# Patient Record
Sex: Female | Born: 1995 | Race: Black or African American | Hispanic: No | Marital: Single | Smoking: Never smoker
Health system: Southern US, Community
[De-identification: ages and names within clinical notes are randomized; demographics above are authoritative.]

## PROBLEM LIST (undated history)

## (undated) DIAGNOSIS — F988 Other specified behavioral and emotional disorders with onset usually occurring in childhood and adolescence: Secondary | ICD-10-CM

---

## 2016-12-24 ENCOUNTER — Encounter (HOSPITAL_COMMUNITY): Payer: Self-pay | Admitting: Emergency Medicine

## 2016-12-24 ENCOUNTER — Ambulatory Visit (HOSPITAL_COMMUNITY)
Admission: EM | Admit: 2016-12-24 | Discharge: 2016-12-24 | Disposition: A | Discharge status: Home / Self Care | Payer: BLUE CROSS/BLUE SHIELD | Attending: Family Medicine | Admitting: Family Medicine

## 2016-12-24 DIAGNOSIS — J029 Acute pharyngitis, unspecified: Secondary | ICD-10-CM | POA: Insufficient documentation

## 2016-12-24 HISTORY — DX: Other specified behavioral and emotional disorders with onset usually occurring in childhood and adolescence: F98.8

## 2016-12-24 LAB — POCT RAPID STREP A: STREPTOCOCCUS, GROUP A SCREEN (DIRECT): NEGATIVE

## 2016-12-24 NOTE — Discharge Instructions (Signed)
We have sent your throat swab for culture and will contact you with the results. You may use OTC medicines such as Tylenol and ibuprofen.

## 2016-12-24 NOTE — ED Provider Notes (Signed)
  [facility]   [id number] 12/24/16 Arrival Time: 1321  ASSESSMENT & PLAN:  1. Sore throat    Rapid strep negative. Culture sent. OTC symptom care. Will call with culture results.  Reviewed expectations re: course of current medical issues. Questions answered. Outlined signs and symptoms indicating need for more acute intervention. Patient verbalized understanding. After Visit Summary given.   SUBJECTIVE:  Kathleen Black is a 21 y.o. female who presents with complaint of acute sore throat beginning yesterday. Afebrile. No respiratory symptoms. No neck pain or swelling. Tolerating normal PO intake but slight discomfort with swallowing. No OTC treatment. No rashes.  ROS: As per HPI.   OBJECTIVE:  Vitals:   12/24/16 1343  BP: 136/89  Pulse: 92  Resp: 18  Temp: 98.3 F (36.8 C)  TempSrc: Oral  SpO2: 97%     General appearance: alert; no distress HEENT: throat with mild erythema and tonsil enlargement; minimal exudate Neck: supple Lungs: clear to auscultation bilaterally Heart: regular rate and rhythm Skin: warm and dry Psychological:  alert and cooperative; normal mood and affect  Results for orders placed or performed during the hospital encounter of 12/24/16  POCT rapid strep A [facility]  Result Value Ref Range   Streptococcus, Group A Screen (Direct) NEGATIVE NEGATIVE    Labs Reviewed  POCT RAPID STREP A    No Known Allergies  PMHx, SurgHx, SocialHx, Medications, and Allergies were reviewed in the Visit Navigator and updated as appropriate.      Mardella LaymanHagler, Breck Hollinger, MD 12/26/16 [phone]

## 2016-12-24 NOTE — ED Notes (Signed)
Obtained throat swab, labeled and placed in lab

## 2016-12-24 NOTE — ED Triage Notes (Signed)
The patient presented to the [facility] with a complaint of a sore throat x 1 day. 

## 2016-12-27 LAB — CULTURE, GROUP A STREP (THRC)

## 2017-02-09 ENCOUNTER — Encounter (HOSPITAL_COMMUNITY): Payer: Self-pay | Admitting: Emergency Medicine

## 2017-02-09 ENCOUNTER — Ambulatory Visit (HOSPITAL_COMMUNITY)
Admission: EM | Admit: 2017-02-09 | Discharge: 2017-02-09 | Disposition: A | Discharge status: Home / Self Care | Payer: BLUE CROSS/BLUE SHIELD | Attending: Family Medicine | Admitting: Family Medicine

## 2017-02-09 DIAGNOSIS — J069 Acute upper respiratory infection, unspecified: Secondary | ICD-10-CM

## 2017-02-09 DIAGNOSIS — H9203 Otalgia, bilateral: Secondary | ICD-10-CM

## 2017-02-09 NOTE — ED Triage Notes (Signed)
Pt reports nasal congestion and drainage x2 days.  Yesterday she began having bilateral ear pain.

## 2017-02-14 NOTE — ED Provider Notes (Signed)
  [facility]   [id number] 02/09/17 Arrival Time: 1334  ASSESSMENT & PLAN:  1. Otalgia of both ears   2. Viral upper respiratory tract infection    OTC analgesics and symptom care as needed. FU in 48-72 hours if not improving, sooner if needed.  Reviewed expectations re: course of current medical issues. Questions answered. Outlined signs and symptoms indicating need for more acute intervention. Patient verbalized understanding. After Visit Summary given.   SUBJECTIVE:  Kathleen Black is a 21 y.o. female who presents with complaint of nasal congestion, post-nasal drainage, and otalgia of both ears. Abrupt onset starting 2 days ago. No ear drainage or hearing changes. Describes otalgia as pressure. Afebrile. Mild dry cough. No OTC treatment.   ROS: As per HPI.   OBJECTIVE:  Vitals:   02/09/17 1424  BP: 115/63  Pulse: 86  Temp: 98.6 F (37 C)  TempSrc: Oral  SpO2: 100%     General appearance: alert; no distress HEENT: nasal congestion; clear runny nose; throat irritation secondary to post-nasal drainage; TMs appear normal Neck: supple without LAD Lungs: clear to auscultation bilaterally Skin: warm and dry Psychological: alert and cooperative; normal mood and affect  No Known Allergies  Past Medical History:  Diagnosis Date  . ADD (attention deficit disorder)    Social History   Social History  . Marital status: Single    Spouse name: N/A  . Number of children: N/A  . Years of education: N/A   Occupational History  . Not on file.   Social History Main Topics  . Smoking status: Never Smoker  . Smokeless tobacco: Never Used  . Alcohol use Yes     Comment: social  . Drug use: No  . Sexual activity: Not on file   Other Topics Concern  . Not on file   Social History Narrative  . No narrative on file           Mardella Layman, MD 02/14/17 [phone]

## 2017-05-21 ENCOUNTER — Encounter (HOSPITAL_COMMUNITY): Payer: Self-pay

## 2017-05-21 ENCOUNTER — Emergency Department (HOSPITAL_COMMUNITY)
Admission: EM | Admit: 2017-05-21 | Discharge: 2017-05-21 | Disposition: A | Discharge status: Home / Self Care | Payer: BLUE CROSS/BLUE SHIELD | Attending: Emergency Medicine | Admitting: Emergency Medicine

## 2017-05-21 ENCOUNTER — Emergency Department (HOSPITAL_COMMUNITY): Payer: BLUE CROSS/BLUE SHIELD

## 2017-05-21 ENCOUNTER — Other Ambulatory Visit: Payer: Self-pay

## 2017-05-21 DIAGNOSIS — Z79899 Other long term (current) drug therapy: Secondary | ICD-10-CM | POA: Diagnosis not present

## 2017-05-21 DIAGNOSIS — Y939 Activity, unspecified: Secondary | ICD-10-CM | POA: Insufficient documentation

## 2017-05-21 DIAGNOSIS — Y929 Unspecified place or not applicable: Secondary | ICD-10-CM | POA: Insufficient documentation

## 2017-05-21 DIAGNOSIS — S92535A Nondisplaced fracture of distal phalanx of left lesser toe(s), initial encounter for closed fracture: Secondary | ICD-10-CM

## 2017-05-21 DIAGNOSIS — S99922A Unspecified injury of left foot, initial encounter: Secondary | ICD-10-CM | POA: Diagnosis present

## 2017-05-21 DIAGNOSIS — Y999 Unspecified external cause status: Secondary | ICD-10-CM | POA: Insufficient documentation

## 2017-05-21 DIAGNOSIS — W010XXA Fall on same level from slipping, tripping and stumbling without subsequent striking against object, initial encounter: Secondary | ICD-10-CM | POA: Insufficient documentation

## 2017-05-21 MED ORDER — NAPROXEN 250 MG PO TABS
500.0000 mg | ORAL_TABLET | Freq: Once | ORAL | Status: AC
  Administered 2017-05-21: 500 mg via ORAL
  Filled 2017-05-21: qty 2

## 2017-05-21 MED ORDER — NAPROXEN 500 MG PO TABS: 500.0000 mg | ORAL_TABLET | Freq: Two times a day (BID) | ORAL | 0 refills | Status: AC

## 2017-05-21 NOTE — ED Provider Notes (Signed)
[facility] EMERGENCY DEPARTMENT Provider Note   CSN: [id number] Arrival date & time: 05/21/17  7829     History   Chief Complaint No chief complaint on file.   HPI Kathleen Black is a 22 y.o. female who presents to the emergency department status post injury to her left fourth toe yesterday evening.  Patient states that she was walking up a ramp in socks and drug the toe causing her to stumble.  States she has had pain to the right fourth toe radiating to the entire midfoot since the incident.  Currently rates pain 9 out of 10 in severity, worse with movement. Denies numbness or weakness. Last tetanus was 1 year ago.   HPI  Past Medical History:  Diagnosis Date  . ADD (attention deficit disorder)     There are no active problems to display for this patient.   History reviewed. No pertinent surgical history.  OB History    No data available       Home Medications    Prior to Admission medications   Medication Sig Start Date End Date Taking? Authorizing Provider  Liraglutide -Weight Management (SAXENDA Langdon) Inject into the skin.    [provider]  PRESCRIPTION MEDICATION     [provider]    Family History History reviewed. No pertinent family history.  Social History Social History   Tobacco Use  . Smoking status: Never Smoker  . Smokeless tobacco: Never Used  Substance Use Topics  . Alcohol use: Yes    Comment: social  . Drug use: No     Allergies   Patient has no known allergies.   Review of Systems Review of Systems  Constitutional: Negative for chills and fever.  Musculoskeletal:       Left 4th toe pain.  Neurological: Negative for weakness and numbness.     Physical Exam Updated Vital Signs BP (!) 115/91 (BP Location: Left Arm)   Pulse 88   Temp 98.4 F (36.9 C) (Oral)   Resp 16   SpO2 98%   Physical Exam  Constitutional: She appears well-developed and well-nourished. No distress.  HENT:    Head: Normocephalic and atraumatic.  Eyes: Conjunctivae are normal. Right eye exhibits no discharge. Left eye exhibits no discharge.  Cardiovascular:  Pulses:      Dorsalis pedis pulses are 2+ on the right side, and 2+ on the left side.  Musculoskeletal:  L foot: Ecchymosis over the 4th digit which extends to MTP area. This area is swollen and tender to palpation. There is a small abrasion over the distal phalanx. Otherwise no bony tenderness- specifically no tenderness at base of the 5th, navicular bone, or medial/lateral malleolus.   Neurological: She is alert.  Clear speech. Sensation grossly intact to lower extremities. Patient is able to move all digits, able to move 4th digit minimally secondary to pain.   Skin: Capillary refill takes less than 2 seconds.  Psychiatric: She has a normal mood and affect. Her behavior is normal. Thought content normal.  Nursing note and vitals reviewed.    ED Treatments / Results  Labs (all labs ordered are listed, but only abnormal results are displayed) Labs Reviewed - No data to display  EKG  EKG Interpretation None       Radiology Dg Foot Complete Left  Result Date: 05/21/2017 CLINICAL DATA:  Left foot pain/ injury EXAM: LEFT FOOT - COMPLETE 3+ VIEW COMPARISON:  None. FINDINGS: Transverse fracture involving the base of the  distal 4th digit. 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 Mild soft tissue swelling of the distal 4th digit. IMPRESSION: Transverse fracture involving the distal 4th digit, as above. Electronically Signed   By: Charline BillsSriyesh  Krishnan M.D.   On: 05/21/2017 10:55    Procedures Procedures (including critical care time)  Medications Ordered in ED Medications  naproxen (NAPROSYN) tablet 500 mg (not administered)   Initial Impression / Assessment and Plan / ED Course  I have reviewed the triage vital signs and the nursing notes.  Pertinent  labs & imaging results that were available during my care of the patient were reviewed by me and considered in my medical decision making (see chart for details).  Patient presents with L 4th toe pain s/p injury yesterday. X-ray revealed transverse fracture involving the 4th distal phalanx. Patient able to move this digit, sensation intact, <2 second refill. Pain managed in ED with Naproxen. Patient's toe buddy taped, placed patient in post op shoe. Will discharge home with Naproxen. Patient advised to follow up with orthopedics for further evaluation and treatment. I discussed results, treatment plan, need for ortho follow-up, and return precautions with the patient. Provided opportunity for questions, patient confirmed understanding and is in agreement with plan. Findings and plan of care discussed with supervising physician Dr. Ranae PalmsYelverton who is in agreement with plan.    Final Clinical Impressions(s) / ED Diagnoses   Final diagnoses:  Closed nondisplaced fracture of distal phalanx of lesser toe of left foot, initial encounter    ED Discharge Orders        Ordered    naproxen (NAPROSYN) 500 MG tablet  2 times daily     05/21/17 1239       Brissia Delisa, NapoleonSamantha R, PA-C 05/21/17 1717    Loren RacerYelverton, David, MD 05/21/17 2003

## 2017-05-21 NOTE — ED Notes (Signed)
States she was getting off the bus last pm didn't have any shoes on just socks states her foot slipped and she heard her toe pop. C/o pain 4th toe left foot.

## 2017-05-21 NOTE — Discharge Instructions (Signed)
Please read and follow all provided instructions.  You have been seen today for pain in your left foot.   Tests performed today include: An x-ray of the affected area - shoes a fracture in the most distal bone of your left 4th toe.  Vital signs. See below for your results today.   Home care instructions: -- *PRICE in the first 24-48 hours after injury: Protect (with brace, splint, sling), if given by your provider Rest Ice- Do not apply ice pack directly to your skin, place towel or similar between your skin and ice/ice pack. Apply ice for 20 min, then remove for 40 min while awake Compression- Wear brace, elastic bandage, splint as directed by your provider Elevate affected extremity above the level of your heart when not walking around for the first 24-48 hours   I have given you a prescription for naproxen- this is a nonsteroidal anti-inflammatory medication -will help with pain and swelling.  Be sure to take this with food as it can cause stomach upset, and at worst stomach bleeding.  Do not take other NSAIDs with this medication including Advil, Motrin, or Aleve.  You may supplement with Tylenol.  Follow-up instructions: Please follow-up with the orthopedic surgeon providing her discharge instructions within the next 5 days.  Call tomorrow to make an appointment for sometime this week.  Return instructions:  Please return if your toes or feet are numb or tingling, appear gray or blue, or you have severe pain (also elevate the leg and loosen splint or wrap if you were given one) Please return to the Emergency Department if you experience worsening symptoms.  Please return if you have any other emergent concerns. Additional Information:  Your vital signs today were: BP (!) 115/91 (BP Location: Left Arm)    Pulse 88    Temp 98.4 F (36.9 C) (Oral)    Resp 16    SpO2 98%  If your blood pressure (BP) was elevated above 135/85 this visit, please have this repeated by your doctor within  one month. ---------------

## 2017-05-21 NOTE — ED Triage Notes (Signed)
Patient complains of left foot and toe pain after rolling same last night while getting off bus, pain with ambulation

## 2017-05-22 ENCOUNTER — Encounter (INDEPENDENT_AMBULATORY_CARE_PROVIDER_SITE_OTHER): Payer: Self-pay | Admitting: Physician Assistant

## 2017-05-22 ENCOUNTER — Ambulatory Visit (INDEPENDENT_AMBULATORY_CARE_PROVIDER_SITE_OTHER): Payer: BLUE CROSS/BLUE SHIELD | Admitting: Physician Assistant

## 2017-05-22 DIAGNOSIS — S92502A Displaced unspecified fracture of left lesser toe(s), initial encounter for closed fracture: Secondary | ICD-10-CM

## 2017-05-22 NOTE — Progress Notes (Signed)
Office Visit Note   Patient: Kathleen Black           Date of Birth: 29-Sep-1995           MRN: [id number] Visit Date: 05/22/2017              Requested by: No referring provider defined for this encounter. PCP: System, Provider Not In   Assessment & Plan: Visit Diagnoses:  1. Closed non-physeal fracture of phalanx of lesser toe of left foot, unspecified phalanx, initial encounter     Plan: She is placed in a Darco shoe left foot.  Should continue buddy taping the fifth and third toes to the fourth toe.  She is to change the tape daily.  Elevation of the foot is encouraged.  Movement of the ankle is encouraged.  I also encouraged her to go on vitamin C 500 mg twice daily for prophylaxis of an RSD.  She will begin massaging the dorsal aspect of the foot and ankle to desensitize this area.  Follow-Up Instructions: Return in about 4 weeks (around 06/19/2017) for Radiographs.   Orders:  No orders of the defined types were placed in this encounter.  No orders of the defined types were placed in this encounter.     Procedures: No procedures performed   Clinical Data: No additional findings.   Subjective: Left fourth toe fracture  HPI Kathleen Black is a 22 year old female who was getting off a bus and tripped injuring her left fourth toe.  She really did not think much about the injury to the left foot until approximately 2 days later whenever she was seen [facility] where radiographs were obtained of her left foot.  Personally reviewed these films that are dated 05/21/2017 and show a transverse fracture involving the base of the fourth digit.  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  No other fractures identified.  She denies any other injuries.  No loss consciousness at the time of the injury.  She was placed in a postop shoe which she states is very uncomfortable. Review of Systems Please see HPI otherwise negative  Objective: Vital Signs: There were  no vitals taken for this visit.  Physical Exam  Constitutional: She is oriented to person, place, and time. She appears well-developed and well-nourished. No distress.  Cardiovascular: Intact distal pulses.  Pulmonary/Chest: Effort normal.  Neurological: She is alert and oriented to person, place, and time.  Skin: She is not diaphoretic.  Psychiatric: She has a normal mood and affect.    Ortho Exam Left foot fourth toe swollen with ecchymosis.  No skin rashes lesions ulcerations.  She has hypersensitivity of the dorsal aspect of the left forefoot dorsally.  Dorsal pedal pulses intact.  She is able to plantarflex ankle.  She has difficulty with dorsiflexion of the ankle secondary to pain. Specialty Comments:  No specialty comments available.  Imaging: No results found.   PMFS History: There are no active problems to display for this patient.  Past Medical History:  Diagnosis Date  . ADD (attention deficit disorder)     History reviewed. No pertinent family history.  History reviewed. No pertinent surgical history. Social History   Occupational History  . Not on file  Tobacco Use  . Smoking status: Never Smoker  . Smokeless tobacco: Never Used  Substance and Sexual Activity  . Alcohol use: Yes    Comment: social  . Drug use: No  . Sexual activity:  Not on file

## 2017-06-26 ENCOUNTER — Ambulatory Visit (INDEPENDENT_AMBULATORY_CARE_PROVIDER_SITE_OTHER): Payer: BLUE CROSS/BLUE SHIELD | Admitting: Physician Assistant

## 2017-06-26 ENCOUNTER — Ambulatory Visit (INDEPENDENT_AMBULATORY_CARE_PROVIDER_SITE_OTHER): Payer: BLUE CROSS/BLUE SHIELD

## 2017-06-26 ENCOUNTER — Encounter (INDEPENDENT_AMBULATORY_CARE_PROVIDER_SITE_OTHER): Payer: Self-pay | Admitting: Physician Assistant

## 2017-06-26 DIAGNOSIS — M79672 Pain in left foot: Secondary | ICD-10-CM | POA: Diagnosis not present

## 2017-06-26 DIAGNOSIS — S92502A Displaced unspecified fracture of left lesser toe(s), initial encounter for closed fracture: Secondary | ICD-10-CM

## 2017-06-26 NOTE — Progress Notes (Signed)
   Office Visit Note   Patient: Kathleen Black           Date of Birth: [birth year]           MRN: [id number] Visit Date: 06/26/2017              Requested by: No referring provider defined for this encounter. PCP: System, Provider Not In   Assessment & Plan: Visit Diagnoses:  1. Pain in left foot   2. Closed non-physeal fracture of phalanx of lesser toe of left foot, unspecified phalanx, initial encounter     Plan: She will transition to a regular shoe out of the Darco as tolerated.  In 3 weeks she can return to activities as tolerated.  She will follow-up on an as-needed basis pain persist or becomes worse.  Questions encouraged and answered.  Follow-Up Instructions: Return if symptoms worsen or fail to improve.   Orders:  Orders Placed This Encounter  Procedures  . XR Foot Complete Left   No orders of the defined types were placed in this encounter.     Procedures: No procedures performed   Clinical Data: No additional findings.   Subjective: Chief Complaint  Patient presents with  . Left Foot - Follow-up    HPI Kathleen Black returns today 5 weeks status post left fourth toe distal phalanx fracture.  She been in a Darco shoe.  She states the foot is trending towards improvement.  She continues to wear the Darco shoe.  She does have some pain in the foot with prolonged standing.  She is been working with a Psychologist, educationaltrainer at the ENT to help desensitize foot.  She did not go on vitamin C.  She states that sensitivity is greatly diminished. Review of Systems Please see HPI otherwise negative  Objective: Vital Signs: There were no vitals taken for this visit.  Physical Exam General: Well-developed well-nourished female no acute distress mood and affect appropriate. Skin: Left foot no rashes skin lesions ulcerations, erythema,calor, temperature variance from the opposite foot or dystrophic changes of the foot. Ortho Exam Left foot: Slight tenderness at the fourth toe distal  phalanx region.  Fourth toe without gross deformity and overall in good position alignment.  Slight edema of the left fourth toe compared to the remainder of the toes.  There is no hyper sensitivity or paresthesia. Specialty Comments:  No specialty comments available.  Imaging: Xr Foot Complete Left  Result Date: 06/26/2017 3 views left foot: Fourth distal phalanx fracture remains nondisplaced.  There is significant consolidation of the fracture.  No other fractures seen throughout the foot.    PMFS History: There are no active problems to display for this patient.  Past Medical History:  Diagnosis Date  . ADD (attention deficit disorder)     History reviewed. No pertinent family history.  History reviewed. No pertinent surgical history. Social History   Occupational History  . Not on file  Tobacco Use  . Smoking status: Never Smoker  . Smokeless tobacco: Never Used  Substance and Sexual Activity  . Alcohol use: Yes    Comment: social  . Drug use: No  . Sexual activity: Not on file

## 2018-08-12 ENCOUNTER — Telehealth: Payer: BLUE CROSS/BLUE SHIELD | Admitting: Nurse Practitioner

## 2018-08-12 DIAGNOSIS — Z20822 Contact with and (suspected) exposure to covid-19: Secondary | ICD-10-CM

## 2018-08-12 DIAGNOSIS — R6889 Other general symptoms and signs: Principal | ICD-10-CM

## 2018-08-12 NOTE — Progress Notes (Signed)
E-Visit for Corona Virus Screening  Based on your current symptoms, you may very well have the virus, however your symptoms are mild. Currently, not all patients are being tested. If the symptoms are mild and there is not a known exposure, performing the test is not indicated.   I agreee with the doctor from [facility]. You should just quarantine your self until you have no further symptoms so as not to expose others. Those with covid 19 that have minor symptoms we are not testing at this time.  Coronavirus disease 2019 (COVID-19) is a respiratory illness that can spread from person to person. The virus that causes COVID-19 is a new virus that was first identified in the country of [place] but is now found in multiple other countries and has spread to the [place].  Symptoms associated with the virus are mild to severe fever, cough, and shortness of breath. There is currently no vaccine to protect against COVID-19, and there is no specific antiviral treatment for the virus.   To be considered HIGH RISK for Coronavirus (COVID-19), you have to meet the following criteria:  . Traveled to [place], [place], [place], [place] or [place]; or in the [place] to [place], [place], [place], or [place]; and have fever, cough, and shortness of breath within the last 2 weeks of travel OR  . Been in close contact with a person diagnosed with COVID-19 within the last 2 weeks and have fever, cough, and shortness of breath  . IF YOU DO NOT MEET THESE CRITERIA, YOU ARE CONSIDERED LOW RISK FOR COVID-19.   It is vitally important that if you feel that you have an infection such as this virus or any other virus that you stay home and away from places where you may spread it to others.  You should self-quarantine for 14 days if you have symptoms that could potentially be coronavirus and avoid contact with people age 41 and older.   You can use medication such as delsym or mucinex OTC for cough  You may also  take acetaminophen (Tylenol) as needed for fever.   Reduce your risk of any infection by using the same precautions used for avoiding the common cold or flu:  Marland Kitchen Wash your hands often with soap and warm water for at least 20 seconds.  If soap and water are not readily available, use an alcohol-based hand sanitizer with at least 60% alcohol.  . If coughing or sneezing, cover your mouth and nose by coughing or sneezing into the elbow areas of your shirt or coat, into a tissue or into your sleeve (not your hands). . Avoid shaking hands with others and consider head nods or verbal greetings only. . Avoid touching your eyes, nose, or mouth with unwashed hands.  . Avoid close contact with people who are sick. . Avoid places or events with large numbers of people in one location, like concerts or sporting events. . Carefully consider travel plans you have or are making. . If you are planning any travel outside or inside the [place], visit the CDC's Travelers' Health webpage for the latest health notices. . If you have some symptoms but not all symptoms, continue to monitor at home and seek medical attention if your symptoms worsen. . If you are having a medical emergency, call 911.  HOME CARE . Only take medications as instructed by your medical team. . Drink plenty of fluids and get plenty of rest. . A steam or ultrasonic  humidifier can help if you have congestion.   GET HELP RIGHT AWAY IF: . You develop worsening fever. . You become short of breath . You cough up blood. . Your symptoms become more severe MAKE SURE YOU   Understand these instructions.  Will watch your condition.  Will get help right away if you are not doing well or get worse.  Your e-visit answers were reviewed by a board certified advanced clinical practitioner to complete your personal care plan.  Depending on the condition, your plan could have included both over the counter or prescription medications.  If there is a  problem please reply once you have received a response from your provider. Your safety is important to [place].  If you have drug allergies check your prescription carefully.    You can use MyChart to ask questions about today's visit, request a non-urgent call back, or ask for a work or school excuse for 24 hours related to this e-Visit. If it has been greater than 24 hours you will need to follow up with your provider, or enter a new e-Visit to address those concerns. You will get an e-mail in the next two days asking about your experience.  I hope that your e-visit has been valuable and will speed your recovery. Thank you for using e-visits.  5 minutes spent reviewing and documenting in chart.

## 2018-08-18 ENCOUNTER — Emergency Department (HOSPITAL_COMMUNITY)
Admission: EM | Admit: 2018-08-18 | Discharge: 2018-08-18 | Disposition: A | Discharge status: Home / Self Care | Payer: Self-pay | Attending: Emergency Medicine | Admitting: Emergency Medicine

## 2018-08-18 ENCOUNTER — Other Ambulatory Visit: Payer: Self-pay

## 2018-08-18 ENCOUNTER — Encounter (HOSPITAL_COMMUNITY): Payer: Self-pay

## 2018-08-18 ENCOUNTER — Emergency Department (HOSPITAL_COMMUNITY): Payer: Self-pay

## 2018-08-18 DIAGNOSIS — R0602 Shortness of breath: Secondary | ICD-10-CM

## 2018-08-18 DIAGNOSIS — B349 Viral infection, unspecified: Secondary | ICD-10-CM | POA: Insufficient documentation

## 2018-08-18 DIAGNOSIS — Z79899 Other long term (current) drug therapy: Secondary | ICD-10-CM | POA: Insufficient documentation

## 2018-08-18 DIAGNOSIS — F909 Attention-deficit hyperactivity disorder, unspecified type: Secondary | ICD-10-CM | POA: Insufficient documentation

## 2018-08-18 MED ORDER — KETOROLAC TROMETHAMINE 30 MG/ML IJ SOLN
30.0000 mg | Freq: Once | INTRAMUSCULAR | Status: AC
  Administered 2018-08-18: 30 mg via INTRAMUSCULAR
  Filled 2018-08-18: qty 1

## 2018-08-18 MED ORDER — METOCLOPRAMIDE HCL 10 MG PO TABS
10.0000 mg | ORAL_TABLET | Freq: Once | ORAL | Status: AC
  Administered 2018-08-18: 10 mg via ORAL
  Filled 2018-08-18: qty 1

## 2018-08-18 MED ORDER — ALBUTEROL SULFATE HFA 108 (90 BASE) MCG/ACT IN AERS
2.0000 | INHALATION_SPRAY | Freq: Once | RESPIRATORY_TRACT | Status: AC
  Administered 2018-08-18: 23:00:00 2 via RESPIRATORY_TRACT
  Filled 2018-08-18: qty 6.7

## 2018-08-18 MED ORDER — ONDANSETRON 4 MG PO TBDP: 4.0000 mg | ORAL_TABLET | Freq: Three times a day (TID) | ORAL | 0 refills | Status: AC | PRN

## 2018-08-18 NOTE — ED Notes (Signed)
PT ambulated at 98% on pulse ox

## 2018-08-18 NOTE — ED Triage Notes (Signed)
Pt traveled to [place] on [place]06[place]18, on 08-12-18 had evisit dx: suspected Covid 19 for onset fever, cough, shortness of breath 08-07-18.  Pt concerned she is still having shortness of breath when walking short distances.  Pt talking in complete sentences, NAD.

## 2018-08-18 NOTE — ED Provider Notes (Signed)
[facility] EMERGENCY DEPARTMENT Provider Note   CSN: [id number] Arrival date & time: 08/18/18  2200    History   Chief Complaint Chief Complaint  Patient presents with  . Shortness of Breath    HPI Kathleen Black is a 23 y.o. female.     23 year old female presents to the emergency department for evaluation of shortness of breath.  Reports traveling to [place] on 08/03/2018.  She began to experience symptoms of nasal congestion, cough, shortness of breath, myalgias 3 days later.  She was seen in [place] by a physician on 08/09/2018 where the patient tested negative for influenza and "everything else".  States that she was presumed to have had COVID-19 and was told to self quarantine for a week.  Returned home on 08/10/18 and had an E-visit for persistent symptoms on 08/12/2018.  Notes increased DOE, even when walking short distances.  She last had a fever over 100.4 F five days ago.  Has continued to use OTC tylenol, ibuprofen without relief.  She feels persistently nauseous, but has not had any vomiting.  Reports anorexia with continued fluid intake, but no food in the past 5 days.  She has no personal history of asthma.  Is otherwise healthy with no known chronic medical problems.  The history is provided by the patient. No language interpreter was used.  Shortness of Breath    Past Medical History:  Diagnosis Date  . ADD (attention deficit disorder)     There are no active problems to display for this patient.   History reviewed. No pertinent surgical history.   OB History   No obstetric history on file.      Home Medications    Prior to Admission medications   Medication Sig Start Date End Date Taking? Authorizing Provider  Liraglutide -Weight Management (SAXENDA [place]) Inject into the skin.    [provider]  naproxen (NAPROSYN) 500 MG tablet Take 1 tablet (500 mg total) by mouth 2 (two) times daily. 05/21/17   Petrucelli, Samantha R,  PA-C  ondansetron (ZOFRAN ODT) 4 MG disintegrating tablet Take 1 tablet (4 mg total) by mouth every 8 (eight) hours as needed for nausea or vomiting. 08/18/18   Antony Madura, PA-C  PRESCRIPTION MEDICATION     [provider]    Family History History reviewed. No pertinent family history.  Social History Social History   Tobacco Use  . Smoking status: Never Smoker  . Smokeless tobacco: Never Used  Substance Use Topics  . Alcohol use: Yes    Comment: social  . Drug use: No     Allergies   Patient has no known allergies.   Review of Systems Review of Systems  Respiratory: Positive for shortness of breath.   Ten systems reviewed and are negative for acute change, except as noted in the HPI.     Physical Exam Updated Vital Signs BP (!) 124/100 (BP Location: Right Arm)   Pulse (!) 114   Temp 99 F (37.2 C) (Oral)   Resp 19   SpO2 96%   Physical Exam Vitals signs and nursing note reviewed.  Constitutional:      General: She is not in acute distress.    Appearance: She is well-developed. She is not diaphoretic.     Comments: Nontoxic appearing and in NAD. Speaking in full sentences.  HENT:     Head: Normocephalic and atraumatic.  Eyes:     General: No scleral icterus.  Conjunctiva/sclera: Conjunctivae normal.  Neck:     Musculoskeletal: Normal range of motion.  Cardiovascular:     Rate and Rhythm: Regular rhythm.     Pulses: Normal pulses.     Comments: Borderline tachycardia. Pulmonary:     Effort: Pulmonary effort is normal. No respiratory distress.     Breath sounds: No stridor. No wheezing or rales.     Comments: Respirations even and unlabored. No tachypnea or hypoxia. Lungs grossly CTAB Musculoskeletal: Normal range of motion.  Skin:    General: Skin is warm and dry.     Coloration: Skin is not pale.     Findings: No erythema or rash.  Neurological:     Mental Status: She is alert and oriented to person, place, and time.     Coordination:  Coordination normal.     Comments: Ambulatory with steady gait.  Psychiatric:        Behavior: Behavior normal.      ED Treatments / Results  Labs (all labs ordered are listed, but only abnormal results are displayed) Labs Reviewed - No data to display  EKG None  Radiology Dg [facility] 1 View  Result Date: 08/18/2018 CLINICAL DATA:  Shortness of breath EXAM: PORTABLE CHEST 1 VIEW COMPARISON:  None. FINDINGS: The heart size and mediastinal contours are within normal limits. Both lungs are clear. The visualized skeletal structures are unremarkable. IMPRESSION: No active disease. Electronically Signed   By: Deatra Robinson M.D.   On: 08/18/2018 22:40    Procedures Procedures (including critical care time)  Medications Ordered in ED Medications  metoCLOPramide (REGLAN) tablet 10 mg (has no administration in time range)  ketorolac (TORADOL) 30 MG/ML injection 30 mg (30 mg Intramuscular Given 08/18/18 2300)  albuterol (PROVENTIL HFA;VENTOLIN HFA) 108 (90 Base) MCG/ACT inhaler 2 puff (2 puffs Inhalation Given 08/18/18 2300)    11:37 PM Patient ambulatory in the ED without hypoxia. SpO2 remained at 98% or above.   Initial Impression / Assessment and Plan / ED Course  I have reviewed the triage vital signs and the nursing notes.  Pertinent labs & imaging results that were available during my care of the patient were reviewed by me and considered in my medical decision making (see chart for details).        The patient presents to the emergency department for complaints of shortness of breath and dyspnea on exertion.  Reports onset of URI symptoms 12 days ago.  Her CXR was negative for acute infiltrate. Patient's symptoms are consistent with likely viral etiology. Discussed that antibiotics are not indicated for viral infections.  Patient will be discharged with symptomatic treatment.  She verbalizes understanding and is agreeable with plan.  The patient is hemodynamically stable and in  NAD prior to discharge.  Kathleen Black was evaluated in Emergency Department on 08/18/2018 for the symptoms described in the history of present illness. She was evaluated in the context of the global COVID-19 pandemic, which necessitated consideration that the patient might be at risk for infection with the SARS-CoV-2 virus that causes COVID-19. Institutional protocols and algorithms that pertain to the evaluation of patients at risk for COVID-19 are in a state of rapid change based on information released by regulatory bodies including the CDC and federal and state organizations. These policies and algorithms were followed during the patient's care in the ED.   Final Clinical Impressions(s) / ED Diagnoses   Final diagnoses:  Viral illness  Shortness of breath    ED Discharge  Orders         Ordered    ondansetron (ZOFRAN ODT) 4 MG disintegrating tablet  Every 8 hours PRN     08/18/18 2336           Antony MaduraHumes, Santiago Stenzel, Cordelia Poche-C 08/18/18 2339    Terrilee FilesButler, Michael C, MD 08/19/18 1057

## 2018-08-18 NOTE — ED Notes (Signed)
E-signature not available, verbalized understanding of DC instructions and prescriptions.  

## 2018-08-18 NOTE — Discharge Instructions (Signed)
We recommend 2 puffs of an albuterol inhaler every 4-6 hours for management of shortness of breath.  You may take 1000 mg Tylenol every 6-8 hours for management of body aches and headache.  Continue to drink plenty of fluids to prevent dehydration.  Use Zofran as prescribed to help manage nausea.  You should self quarantine until you are 72 hours past symptom resolution.  Return to the ED for new or concerning symptoms, especially if shortness of breath worsens, if you pass out, if you are coughing up blood, or if you begin to experience vomiting that is not controlled with nausea medication.

## 2019-11-26 IMAGING — DX DG FOOT COMPLETE 3+V*L*
3 series · 3 of 3 positions shown · non-contrast
Comparison: None.

CLINICAL DATA: Left foot pain/ injury

EXAM:
LEFT FOOT - COMPLETE 3+ VIEW

[x foot ap left]
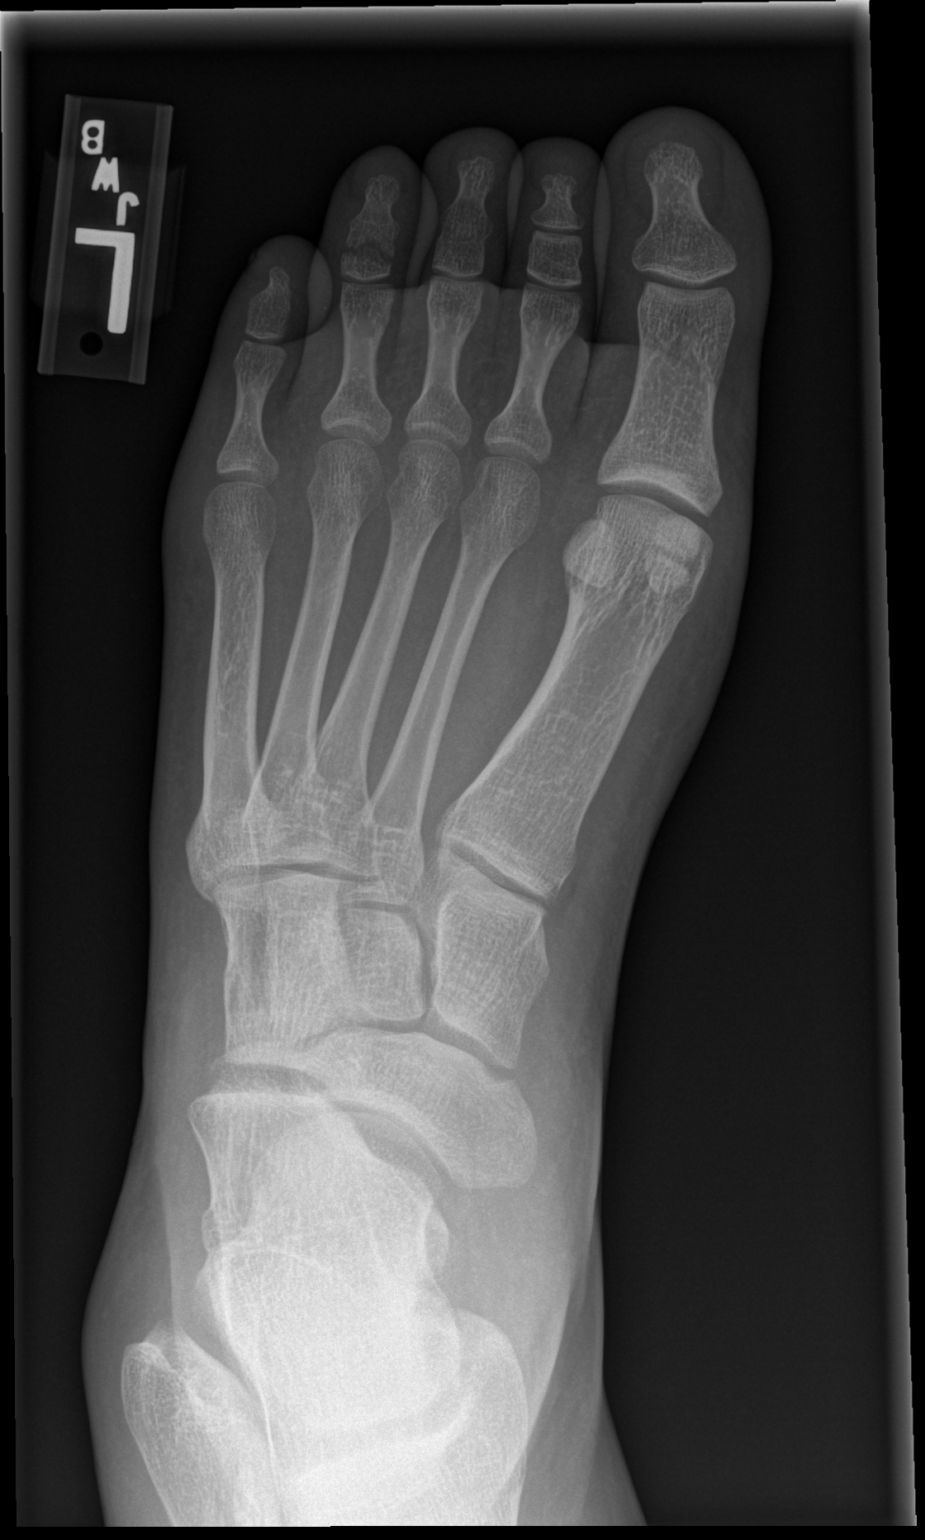

[x foot obl left]
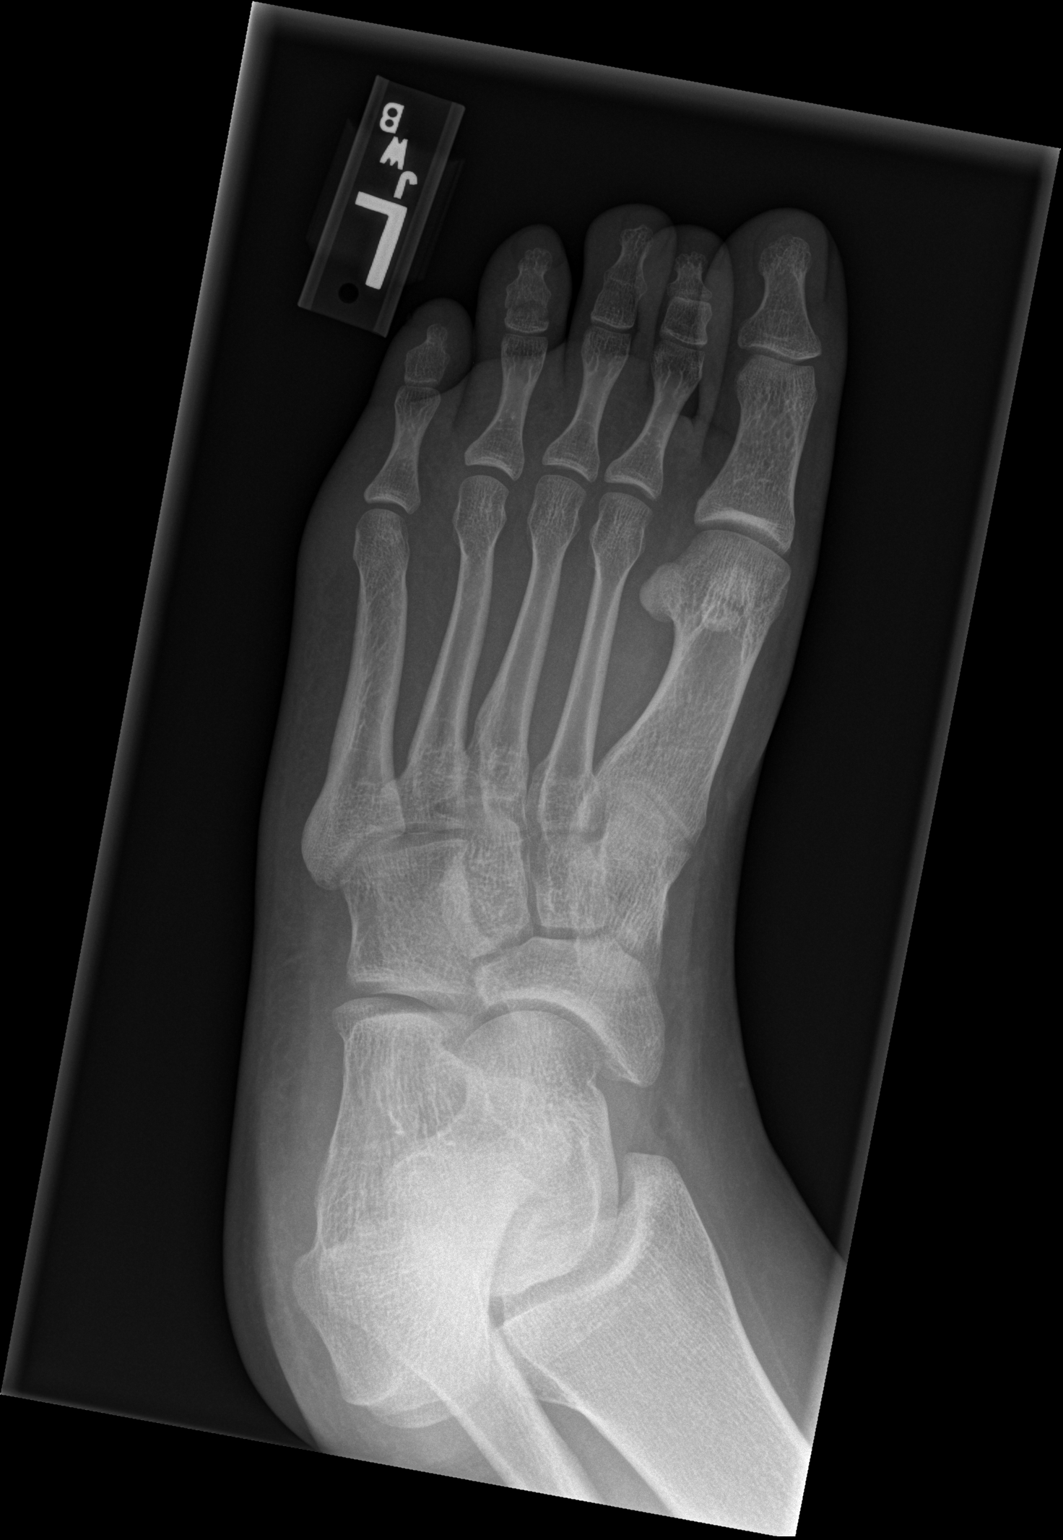

[x foot lat left]
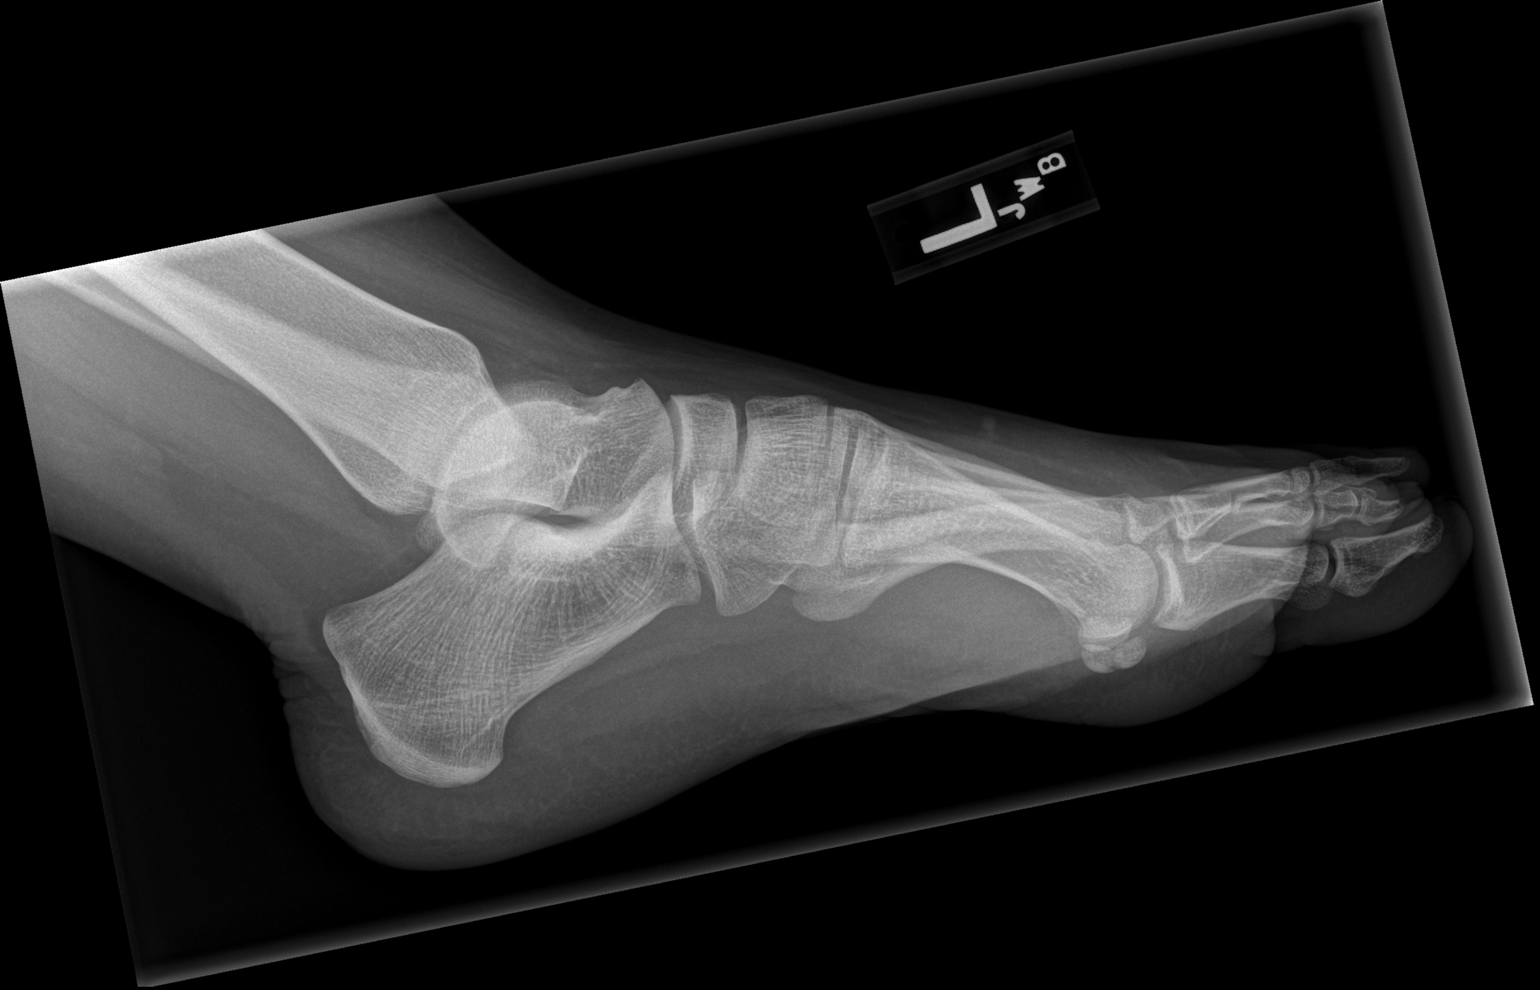

[3 of 3 positions shown; findings below may reference images not displayed]

FINDINGS: Transverse fracture involving the base of the distal 4th digit.
Congenital ankylosis of the distal 4th digit at the DIP joint.

Congenital ankylosis of the distal 5th digit at the DIP joint.

Mild soft tissue swelling of the distal 4th digit.
IMPRESSION: Transverse fracture involving the distal 4th digit, as above.

## 2021-02-22 IMAGING — DX PORTABLE CHEST - 1 VIEW
1 series · 1 of 1 positions shown · non-contrast
Comparison: None.

CLINICAL DATA: Shortness of breath

EXAM:
PORTABLE CHEST 1 VIEW

[chest ap]
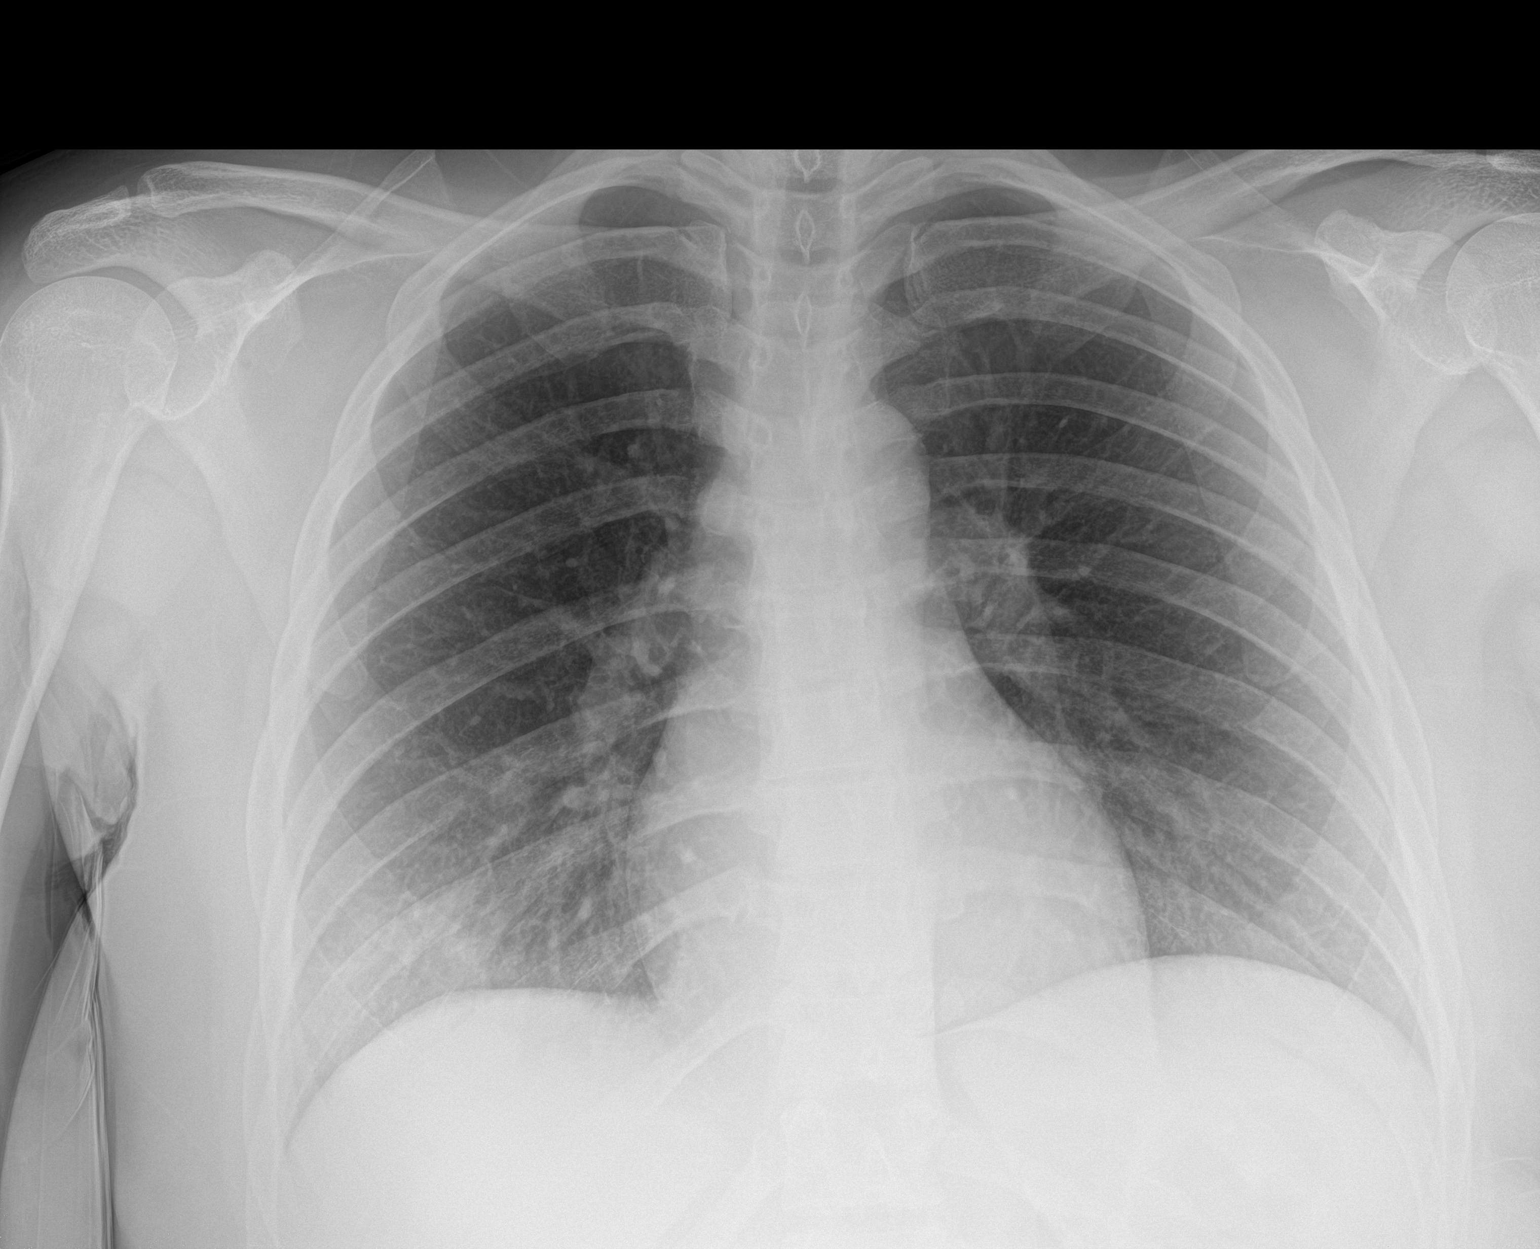

[1 of 1 positions shown; findings below may reference images not displayed]

FINDINGS: The heart size and mediastinal contours are within normal limits.
Both lungs are clear. The visualized skeletal structures are
unremarkable.
IMPRESSION: No active disease.
